# Patient Record
Sex: Male | Born: 1995 | Race: White | Hispanic: No | Marital: Married | Smoking: Never smoker
Health system: Southern US, Community
[De-identification: ages and names within clinical notes are randomized; demographics above are authoritative.]

## PROBLEM LIST (undated history)

## (undated) DIAGNOSIS — G43909 Migraine, unspecified, not intractable, without status migrainosus: Secondary | ICD-10-CM

## (undated) HISTORY — DX: Migraine, unspecified, not intractable, without status migrainosus: G43.909

## (undated) HISTORY — PX: NO PAST SURGERIES: SHX2092

---

## 2013-01-26 ENCOUNTER — Ambulatory Visit: Payer: Self-pay | Admitting: Internal Medicine

## 2016-07-03 ENCOUNTER — Emergency Department: Payer: BLUE CROSS/BLUE SHIELD

## 2016-07-03 ENCOUNTER — Emergency Department
Admission: EM | Admit: 2016-07-03 | Discharge: 2016-07-03 | Disposition: A | Discharge status: Home / Self Care | Payer: BLUE CROSS/BLUE SHIELD | Attending: Emergency Medicine | Admitting: Emergency Medicine

## 2016-07-03 DIAGNOSIS — R0602 Shortness of breath: Secondary | ICD-10-CM | POA: Insufficient documentation

## 2016-07-03 LAB — CBC
HCT: 39.2 % — ABNORMAL LOW (ref 40.0–52.0)
HEMOGLOBIN: 13.5 g/dL (ref 13.0–18.0)
MCH: 29.6 pg (ref 26.0–34.0)
MCHC: 34.4 g/dL (ref 32.0–36.0)
MCV: 86.1 fL (ref 80.0–100.0)
Platelets: 186 10*3/uL (ref 150–440)
RBC: 4.56 MIL/uL (ref 4.40–5.90)
RDW: 13.8 % (ref 11.5–14.5)
WBC: 6.6 10*3/uL (ref 3.8–10.6)

## 2016-07-03 LAB — BASIC METABOLIC PANEL
ANION GAP: 8 (ref 5–15)
BUN: 20 mg/dL (ref 6–20)
CALCIUM: 9.8 mg/dL (ref 8.9–10.3)
CO2: 28 mmol/L (ref 22–32)
Chloride: 104 mmol/L (ref 101–111)
Creatinine, Ser: 0.95 mg/dL (ref 0.61–1.24)
GLUCOSE: 90 mg/dL (ref 65–99)
Potassium: 4.1 mmol/L (ref 3.5–5.1)
SODIUM: 140 mmol/L (ref 135–145)

## 2016-07-03 LAB — TROPONIN I

## 2016-07-03 MED ORDER — FLUTICASONE PROPIONATE 50 MCG/ACT NA SUSP: 1.0000 | Freq: Every day | NASAL | 2 refills | Status: DC

## 2016-07-03 NOTE — ED Notes (Signed)
Pt discharged to home.  Family member driving.  Discharge instructions reviewed.  Verbalized understanding.  No questions or concerns at this time.  Teach back verified.  Pt in NAD.  No items left in ED.   

## 2016-07-03 NOTE — Discharge Instructions (Signed)
Your shortness of breath is likely from seasonal allergies or virus.   Stay out of the heat, drink plenty of water.   Use flonase twice daily. Use over the counter cough and congestion medicine.   See your doctor.   Return to ER if you have worse congestion, shortness of breath, chest pain

## 2016-07-03 NOTE — ED Provider Notes (Signed)
[facility]-EMERGENCY DEPARTMENT Provider Note   CSN: [id number] Arrival date & time: 07/03/16  1458  First Provider Contact:  None       History   Chief Complaint Chief Complaint  Patient presents with  . Shortness of Breath    HPI Paul Holder is a 20 y.o. male otherwise healthy who presented with some shortness breath. Some URI symptoms several weeks ago that resolved. He noticed some occasional shortness of breath specially when he stays still. Denies any shortness of breath with exertion. Denies any cough or fevers. Denies any sinus congestion. Denies any recent travel or history of blood clots. Denies any history or family history of CAD. Sent in by walk in clinic for evaluation.     The history is provided by the patient.    History reviewed. No pertinent past medical history.  There are no active problems to display for this patient.   History reviewed. No pertinent surgical history.     Home Medications    Prior to Admission medications   Not on File    Family History No family history on file.  Social History Social History  Substance Use Topics  . Smoking status: Never Smoker  . Smokeless tobacco: Never Used  . Alcohol use No     Allergies   Review of patient's allergies indicates no known allergies.   Review of Systems Review of Systems  Respiratory: Positive for shortness of breath.   All other systems reviewed and are negative.    Physical Exam Updated Vital Signs BP (!) 106/53 (BP Location: Left Arm)   Temp 97.6 F (36.4 C) (Oral)   Resp 16   Ht 5\' 11"  (1.803 m)   Wt 160 lb (72.6 kg)   SpO2 100%   BMI 22.32 kg/m   Physical Exam  Constitutional: He appears well-developed and well-nourished.  HENT:  Head: Normocephalic and atraumatic.  Mouth/Throat: Oropharynx is clear and moist.  Eyes: EOM are normal. Pupils are equal, round, and reactive to light.  Neck: Normal range of motion. Neck supple.  Cardiovascular: Normal rate and  regular rhythm.   Pulmonary/Chest: Effort normal and breath sounds normal.  Abdominal: Soft. Bowel sounds are normal.  Musculoskeletal: Normal range of motion. He exhibits no edema or tenderness.  Neurological: He is alert. He has normal reflexes.  Skin: Skin is warm.  Psychiatric: He has a normal mood and affect.  Nursing note and vitals reviewed.    ED Treatments / Results  Labs (all labs ordered are listed, but only abnormal results are displayed) Labs Reviewed  CBC - Abnormal; Notable for the following:       Result Value   HCT 39.2 (*)    All other components within normal limits  BASIC METABOLIC PANEL  TROPONIN I    EKG  EKG Interpretation None       ED ECG REPORT I, Junaid Wurzer, the attending physician, personally viewed and interpreted this ECG.   Date: 07/03/2016  EKG Time: 15:16  Rate: 55  Rhythm: normal EKG, normal sinus rhythm, sinus bradycardia  Axis: normal  Intervals:none  ST&T Change: none  Radiology Dg Chest 2 View  Result Date: 07/03/2016 CLINICAL DATA:  PT claims he has shortness of breath. Symptom begin earlier this week. EXAM: CHEST  2 VIEW COMPARISON:  None. FINDINGS: The heart size and mediastinal contours are within normal limits. Both lungs are clear. No pleural effusion or pneumothorax. The visualized skeletal structures are unremarkable. IMPRESSION: Normal chest radiographs. Electronically Signed  By: Amie Portland M.D.   On: 07/03/2016 15:45   Procedures Procedures (including critical care time)  Medications Ordered in ED Medications - No data to display   Initial Impression / Assessment and Plan / ED Course  I have reviewed the triage vital signs and the nursing notes.  Pertinent labs & imaging results that were available during my care of the patient were reviewed by me and considered in my medical decision making (see chart for details).  Clinical Course     Paul Holder is a 20 y.o. male here with subjective shortness of  breath. Not hypoxic or tachy or hypotensive in the ED. PERC neg so will not need D-dimer. Labs and CXR unremarkable. Likely viral vs seasonal allergies. Recommend flonase, over the counter cough meds.    Final Clinical Impressions(s) / ED Diagnoses   Final diagnoses:  None    New Prescriptions New Prescriptions   No medications on file     Charlynne Pander, MD 07/03/16 [phone]

## 2016-07-03 NOTE — ED Triage Notes (Signed)
Pt sent from [facility] with c/O SOB with rest.. States his systolic was 99 sitting and 107 standing.. Pt states he used homeopathic treatment 2 weeks ago for URI sx.. Denies any chest pain.Marland Kitchen

## 2016-07-25 ENCOUNTER — Encounter: Payer: Self-pay | Admitting: Pulmonary Disease

## 2016-07-25 ENCOUNTER — Ambulatory Visit (INDEPENDENT_AMBULATORY_CARE_PROVIDER_SITE_OTHER): Payer: BLUE CROSS/BLUE SHIELD | Admitting: Pulmonary Disease

## 2016-07-25 VITALS — BP 122/70 | HR 71 | Ht 71.0 in | Wt 168.0 lb

## 2016-07-25 DIAGNOSIS — J45998 Other asthma: Secondary | ICD-10-CM

## 2016-07-25 DIAGNOSIS — R0600 Dyspnea, unspecified: Secondary | ICD-10-CM | POA: Diagnosis not present

## 2016-07-25 MED ORDER — FLUTICASONE FUROATE-VILANTEROL 100-25 MCG/INH IN AEPB: 1.0000 | INHALATION_SPRAY | Freq: Every day | RESPIRATORY_TRACT | Status: DC

## 2016-07-25 MED ORDER — FLUTICASONE FUROATE-VILANTEROL 100-25 MCG/INH IN AEPB: 1.0000 | INHALATION_SPRAY | Freq: Every day | RESPIRATORY_TRACT | 0 refills | Status: AC

## 2016-07-25 MED ORDER — ALBUTEROL SULFATE HFA 108 (90 BASE) MCG/ACT IN AERS: 2.0000 | INHALATION_SPRAY | Freq: Four times a day (QID) | RESPIRATORY_TRACT | 6 refills | Status: DC | PRN

## 2016-07-25 NOTE — Progress Notes (Signed)
Patient ID: Paul SmothersMatthew Holder, male   DOB: 06/18/1996, 20 y.o.   MRN: [id number] Patient seen in the office today and instructed on use of Breo Ellipta.  Patient expressed understanding and demonstrated technique.

## 2016-07-25 NOTE — Progress Notes (Signed)
[email or web address]

## 2016-07-25 NOTE — Progress Notes (Signed)
PULMONARY CONSULT NOTE  Requesting MD/Service: self Date of initial consultation: 07/25/16 Reason for consultation: exertional dyspnea  PT PROFILE: 20 athletic M (martial arts) with 1 month of dyspnea after "chest cold"  HPI:  [age] M with no significant PMH, no prior hx of pulmonary or respiratory problems. He is athletic and trains vigorously in martial arts. In mid-July he developed a "chest cold" with cough, mucopurulent sputum, chest tightness and dyspnea. The chest tightness and dyspnea have persisted with exertion and it is interfering with his training. Currently denies CP, fever, purulent sputum, hemoptysis, LE edema and calf tenderness   History reviewed. No pertinent past medical history.  History reviewed. No pertinent surgical history.  MEDICATIONS: I have reviewed all medications and confirmed regimen as documented  Social History   Social History  . Marital status: Single    Spouse name: N/A  . Number of children: N/A  . Years of education: N/A   Occupational History  . Not on file.   Social History Main Topics  . Smoking status: Never Smoker  . Smokeless tobacco: Never Used  . Alcohol use No  . Drug use: No  . Sexual activity: Yes   Other Topics Concern  . Not on file   Social History Narrative  . No narrative on file  [employer] @ [employer] [address]. 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 Never smoked. No illicit drugs or significant alcohol abuse  FH: noncontributory  ROS: No fever, myalgias/arthralgias, unexplained weight loss or weight gain No new focal weakness or sensory deficits No otalgia, hearing loss, visual changes, nasal and sinus symptoms, mouth and throat problems No neck pain or adenopathy No abdominal pain, N/V/D, diarrhea, change in bowel pattern No dysuria, change in urinary pattern   Vitals:   07/25/16 0859  BP: 122/70  Pulse: 71  SpO2: 98%  Weight: 168 lb (76.2 kg)  Height: 5\' 11"  (1.803 m)     EXAM:  Gen: WDWN, No overt respiratory distress HEENT: NCAT,  sclera white, oropharynx normal Neck: Supple without LAN, thyromegaly, JVD Lungs: breath sounds: full, percussion: normal, No adventitious sounds Cardiovascular: RRR, no murmurs noted Abdomen: Soft, nontender, normal BS Ext: without clubbing, cyanosis, edema Neuro: CNs grossly intact, motor and sensory intact Skin: Limited exam, no lesions noted  DATA:   BMP Latest Ref Rng & Units 07/03/2016  Glucose 65 - 99 mg/dL 90  BUN 6 - 20 mg/dL 20  Creatinine 6.210.61 - 3.081.24 mg/dL 6.570.95  Sodium 846135 - 962145 mmol/L 140  Potassium 3.5 - 5.1 mmol/L 4.1  Chloride 101 - 111 mmol/L 104  CO2 22 - 32 mmol/L 28  Calcium 8.9 - 10.3 mg/dL 9.8    CBC Latest Ref Rng & Units 07/03/2016  WBC 3.8 - 10.6 K/uL 6.6  Hemoglobin 13.0 - 18.0 g/dL 95.213.5  Hematocrit 84.140.0 - 52.0 % 39.2(L)  Platelets 150 - 440 K/uL 186    CXR (07/03/16):  NACPD Spirometry (07/25/16): entirely normal  IMPRESSION:     ICD-9-CM ICD-10-CM   1. Dyspnea 786.09 R06.00 Spirometry with Graph  2. 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 Also instructed to take prior to training ROV 08/14/16   Paul Fischeravid Santino Kinsella, MD PCCM service Mobile [phone] Pager [phone] 07/25/2016

## 2016-07-31 ENCOUNTER — Ambulatory Visit (INDEPENDENT_AMBULATORY_CARE_PROVIDER_SITE_OTHER): Payer: BLUE CROSS/BLUE SHIELD | Admitting: Family Medicine

## 2016-07-31 ENCOUNTER — Encounter: Payer: Self-pay | Admitting: Family Medicine

## 2016-07-31 DIAGNOSIS — R0602 Shortness of breath: Secondary | ICD-10-CM | POA: Diagnosis not present

## 2016-07-31 NOTE — Patient Instructions (Signed)
Continue the inhalers.  I anticipate you're going to improve over the next few weeks.  Consider a course of steroids if you do not improve.  Take care  Dr. Adriana Simasook

## 2016-07-31 NOTE — Progress Notes (Signed)
Subjective:  Patient ID: Paul Holder, male    DOB: 19-Jun-1996  Age: 20 y.o. MRN: [id number]  CC: SOB  HPI Paul Holder is a 20 y.o. male presents to the clinic today with the above complaint.  SOB  Patient states that he has had shortness of breath and chest tightness for the past month.  He states that it started after a bout of acute bronchitis.  He denies cough.  No reports of chest pain or palpitations.  He states it is difficult for him to get a good deep breath.  No associated fever or chills.  No weight loss.  Patient states that he does clear his throat often and occasionally gets up sputum.  He has seen pulmonology recently regarding this. He was also seen in the ED at the end of July. Chest x-ray normal. Spirometry normal. Patient is currently on Breo and PRN albuterol per pulm.  Patient states that he's had no improvement with the inhalers.  No known exacerbating or relieving factors. No other complaints at this time.  PMH, Surgical Hx, Family Hx, Social History reviewed and updated as below.  Past Medical History:  Diagnosis Date  . Migraines    Past Surgical History:  Procedure Laterality Date  . NO PAST SURGERIES     History reviewed. No pertinent family history. Patient denies family history.  Social History  Substance Use Topics  . Smoking status: Never Smoker  . Smokeless tobacco: Never Used  . Alcohol use Yes   Review of Systems  Respiratory: Positive for shortness of breath.   All other systems reviewed and are negative.  Objective:   Today's Vitals: BP 110/62 (BP Location: Right Arm, Patient Position: Sitting, Cuff Size: Normal)   Pulse (!) 59   Temp 98.2 F (36.8 C) (Oral)   Ht 5\' 11"  (1.803 m)   Wt 167 lb 4 oz (75.9 kg)   SpO2 98%   BMI 23.33 kg/m   Physical Exam  Constitutional: He is oriented to person, place, and time. He appears well-developed. No distress.  HENT:  Head: Normocephalic and atraumatic.  Mouth/Throat:  No oropharyngeal exudate.  Eyes: Conjunctivae are normal. No scleral icterus.  Neck: Neck supple.  Cardiovascular: Normal rate and regular rhythm.   Pulmonary/Chest: Effort normal. He has no wheezes. He has no rales.  Abdominal: Soft. He exhibits no distension. There is no tenderness. There is no rebound and no guarding.  Musculoskeletal: Normal range of motion. He exhibits no edema.  Lymphadenopathy:    He has no cervical adenopathy.  Neurological: He is alert and oriented to person, place, and time.  Skin: Skin is warm. No rash noted.  Psychiatric: He has a normal mood and affect.  Vitals reviewed.  Assessment & Plan:   Problem List Items Addressed This Visit    SOB (shortness of breath)    New problem.  Unclear etiology/prognosis. Favor post infectious asthma. Patient has had workup for this. Advise continue use of his Breo and Albuterol. Offered course of systemic steroids and he declined. I anticipate he will recover over the new few weeks. If not will need follow up.       Other Visit Diagnoses   None.    Outpatient Encounter Prescriptions as of 07/31/2016  Medication Sig  . albuterol (PROVENTIL HFA;VENTOLIN HFA) 108 (90 Base) MCG/ACT inhaler Inhale 2 puffs into the lungs every 6 (six) hours as needed for wheezing or shortness of breath.  Marland Kitchen acetaminophen (TYLENOL) 500 MG tablet Take 500 mg  by mouth as needed.   Facility-Administered Encounter Medications as of 07/31/2016  Medication  . fluticasone furoate-vilanterol (BREO ELLIPTA) 100-25 MCG/INH 1 puff   Follow-up: PRN  Everlene OtherJayce Andersson L[place] DO [facility] [place]

## 2016-07-31 NOTE — Assessment & Plan Note (Signed)
New problem.  Unclear etiology/prognosis. Favor post infectious asthma. Patient has had workup for this. Advise continue use of his Breo and Albuterol. Offered course of systemic steroids and he declined. I anticipate he will recover over the new few weeks. If not will need follow up.

## 2016-08-15 ENCOUNTER — Ambulatory Visit: Payer: BLUE CROSS/BLUE SHIELD | Admitting: Pulmonary Disease

## 2017-04-16 IMAGING — CR DG CHEST 2V
1 series · 2 of 2 positions shown · non-contrast
Comparison: None.

CLINICAL DATA: PT claims he has shortness of breath. Symptom begin
earlier this week.

EXAM:
CHEST  2 VIEW

[Series 1: dg chest 2 view · 0.14mm/px · 2 of 2 slices shown]
[im 1/2]
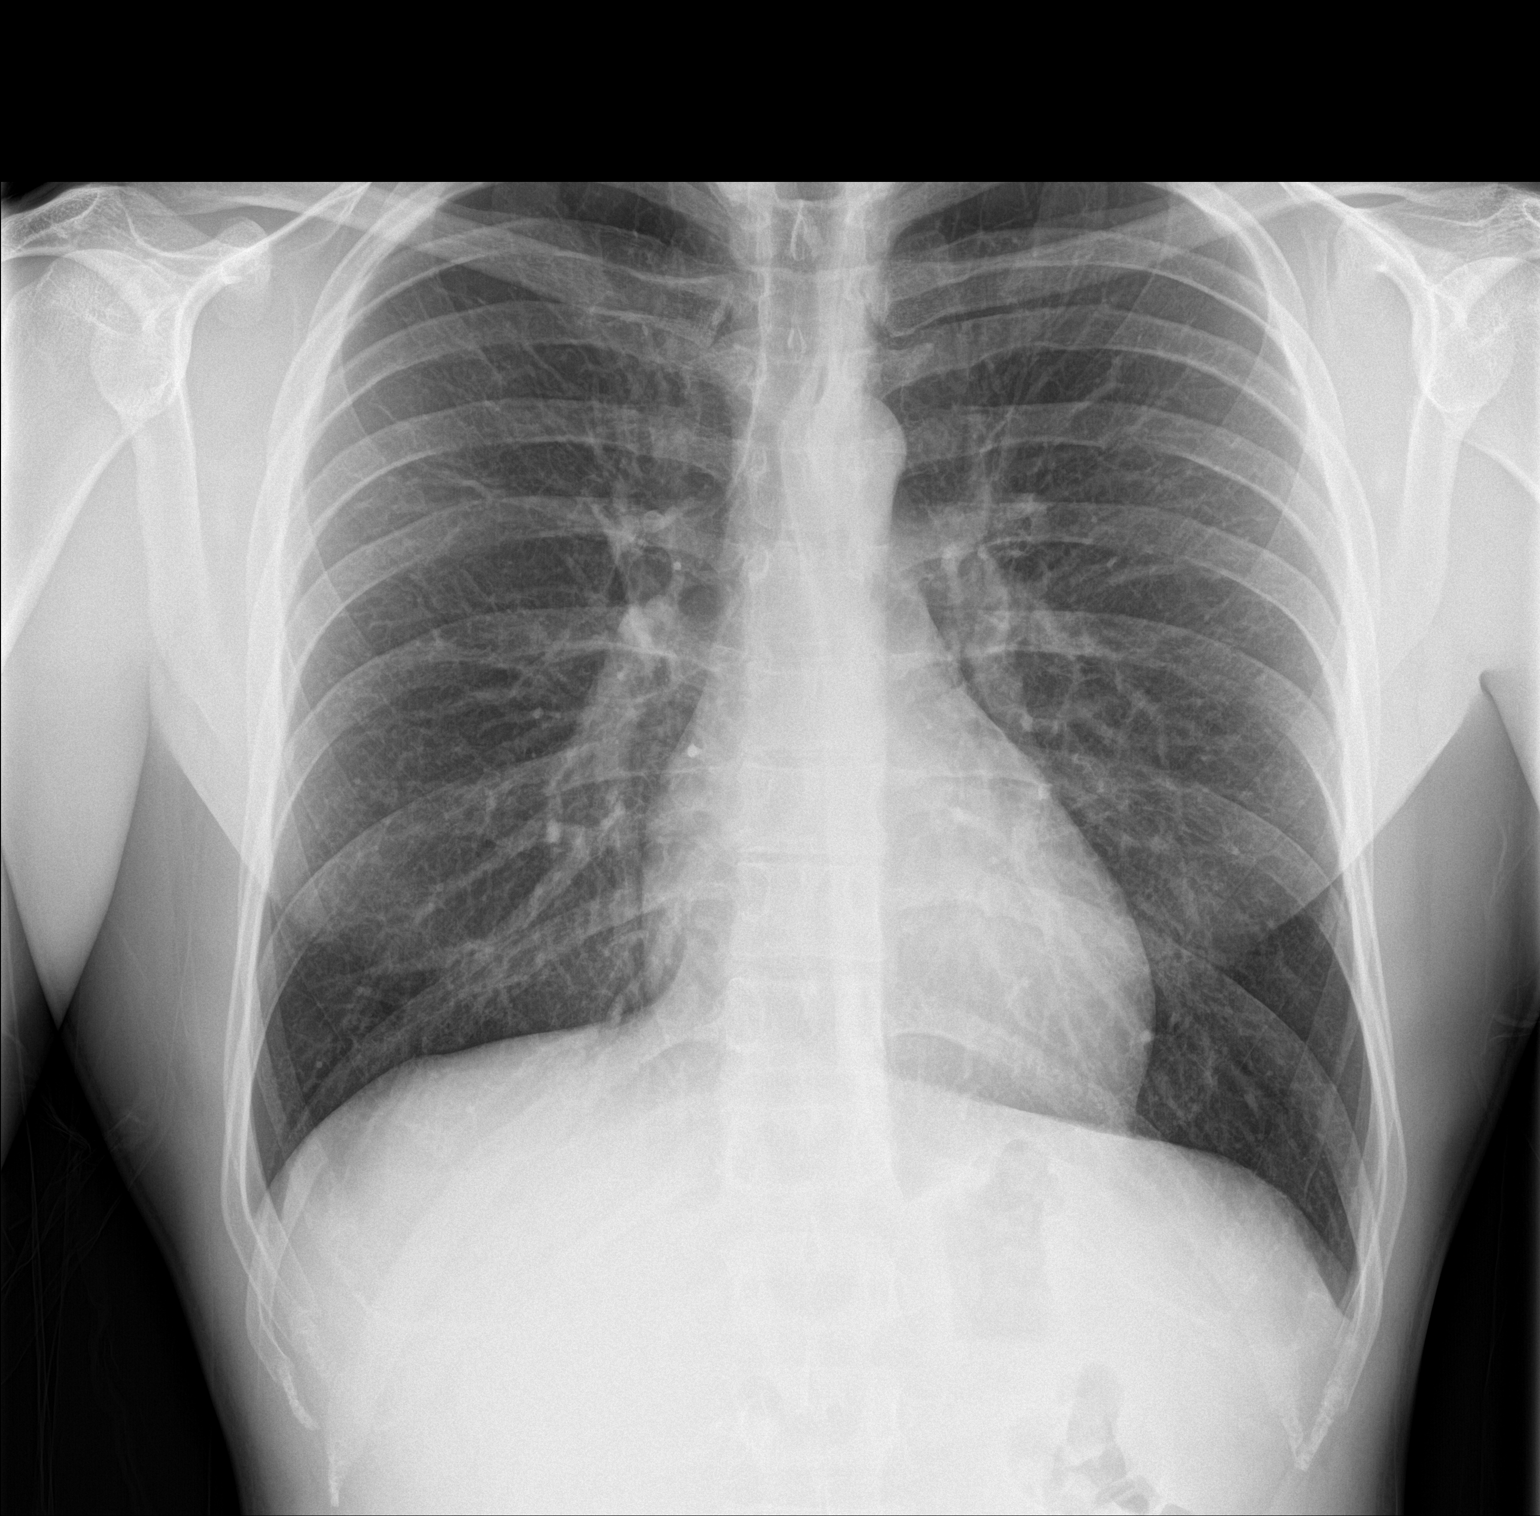
[im 2/2]
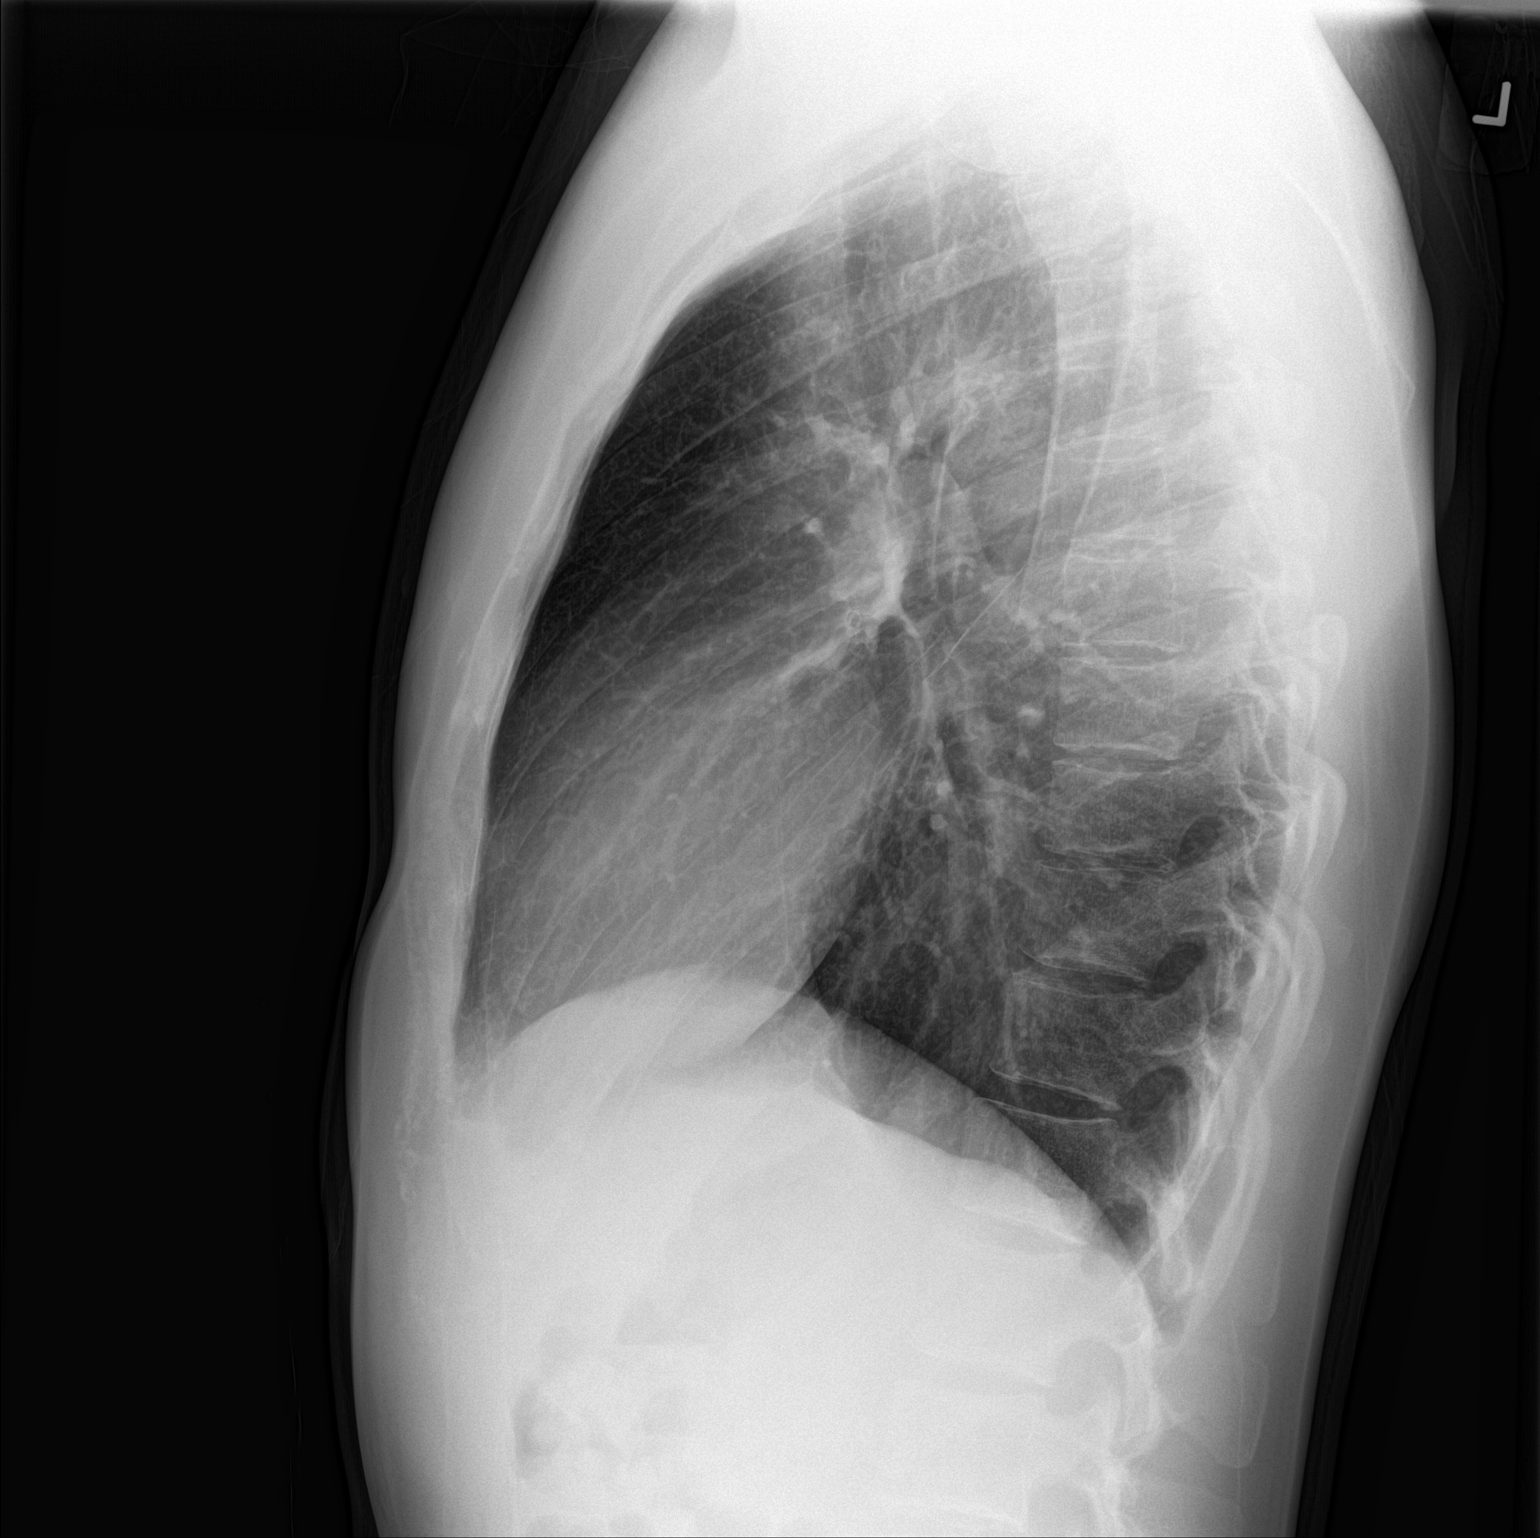

[2 of 2 positions shown; findings below may reference images not displayed]

FINDINGS: The heart size and mediastinal contours are within normal limits.
Both lungs are clear. No pleural effusion or pneumothorax. The
visualized skeletal structures are unremarkable.
IMPRESSION: Normal chest radiographs.

## 2023-03-06 ENCOUNTER — Ambulatory Visit
Admission: EM | Admit: 2023-03-06 | Discharge: 2023-03-06 | Disposition: A | Discharge status: Home / Self Care | Payer: PRIVATE HEALTH INSURANCE | Attending: Urgent Care | Admitting: Urgent Care

## 2023-03-06 DIAGNOSIS — J029 Acute pharyngitis, unspecified: Secondary | ICD-10-CM

## 2023-03-06 LAB — POCT RAPID STREP A (OFFICE): Rapid Strep A Screen: NEGATIVE

## 2023-03-06 MED ORDER — AMOXICILLIN 500 MG PO CAPS: 500.0000 mg | ORAL_CAPSULE | Freq: Two times a day (BID) | ORAL | 0 refills | Status: AC

## 2023-03-06 NOTE — ED Provider Notes (Signed)
Roderic Palau    CSN: [id number] Arrival date & time: 03/06/23  [id number]      History   Chief Complaint Chief Complaint  Patient presents with   Sore Throat    HPI Paul Holder is a 27 y.o. male.    Sore Throat    Presents to urgent care with complaint of sore throat starting 10 days ago and not improving.  He reports bilateral otalgia.  Using OTC medication for symptom control.  Endorses some cough. No fever.  Past Medical History:  Diagnosis Date   Migraines     Patient Active Problem List   Diagnosis Date Noted   SOB (shortness of breath) 07/31/2016    Past Surgical History:  Procedure Laterality Date   NO PAST SURGERIES         Home Medications    Prior to Admission medications   Medication Sig Start Date End Date Taking? Authorizing Provider  acetaminophen (TYLENOL) 500 MG tablet Take 500 mg by mouth as needed.   Yes [provider]  albuterol (PROVENTIL HFA;VENTOLIN HFA) 108 (90 Base) MCG/ACT inhaler Inhale 2 puffs into the lungs every 6 (six) hours as needed for wheezing or shortness of breath. 07/25/16   Wilhelmina Mcardle, MD    Family History History reviewed. No pertinent family history.  Social History Social History   Tobacco Use   Smoking status: Never   Smokeless tobacco: Never  Substance Use Topics   Alcohol use: Yes   Drug use: No     Allergies   Patient has no known allergies.   Review of Systems Review of Systems   Physical Exam Triage Vital Signs ED Triage Vitals [03/06/23 0909]  Enc Vitals Group     BP 136/83     Pulse Rate 69     Resp 16     Temp 98.7 F (37.1 C)     Temp Source Oral     SpO2 97 %     Weight      Height      Head Circumference      Peak Flow      Pain Score 5     Pain Loc      Pain Edu?      Excl. in [place]?    No data found.  Updated Vital Signs BP 136/83 (BP Location: Right Arm)   Pulse 69   Temp 98.7 F (37.1 C) (Oral)   Resp 16   SpO2 97%   Visual Acuity Right  Eye Distance:   Left Eye Distance:   Bilateral Distance:    Right Eye Near:   Left Eye Near:    Bilateral Near:     Physical Exam Vitals reviewed.  Constitutional:      Appearance: He is well-developed.  HENT:     Right Ear: Tympanic membrane normal.     Left Ear: Tympanic membrane normal.     Mouth/Throat:     Mouth: Mucous membranes are moist.     Pharynx: Posterior oropharyngeal erythema present. No oropharyngeal exudate.     Tonsils: No tonsillar exudate.     Comments: Possible tonsillar stone is present. Cardiovascular:     Rate and Rhythm: Normal rate and regular rhythm.     Heart sounds: Normal heart sounds.  Pulmonary:     Effort: Pulmonary effort is normal.     Breath sounds: Normal breath sounds.  Skin:    General: Skin is warm and dry.  Neurological:  General: No focal deficit present.     Mental Status: He is alert and oriented to person, place, and time.  Psychiatric:        Mood and Affect: Mood normal.        Behavior: Behavior normal.      UC Treatments / Results  Labs (all labs ordered are listed, but only abnormal results are displayed) Labs Reviewed  POCT RAPID STREP A (OFFICE)    EKG   Radiology No results found.  Procedures Procedures (including critical care time)  Medications Ordered in UC Medications - No data to display  Initial Impression / Assessment and Plan / UC Course  I have reviewed the triage vital signs and the nursing notes.  Pertinent labs & imaging results that were available during my care of the patient were reviewed by me and considered in my medical decision making (see chart for details).   Patient is afebrile here without recent antipyretics. Satting well on room air. Overall is well appearing, well hydrated, without respiratory distress. Pulmonary exam is unremarkable.  Lungs CTAB without wheezing, rhonchi, rales.  Very erythematous pharynx with right-sided tonsillar stone removed via curette.  Rapid strep  is negative however patient will be treated for strep based on a clinical diagnosis.  Sending confirmatory culture.  Will continue to use OTC medication for symptom control.  Patient acknowledges understanding and agreement with this treatment plan.    Final Clinical Impressions(s) / UC Diagnoses   Final diagnoses:  None   Discharge Instructions   None    ED Prescriptions   None    PDMP not reviewed this encounter.   Rose Phi, [place] 03/06/23 [phone]

## 2023-03-06 NOTE — ED Triage Notes (Signed)
Sore throat that started 10 days ago that has not improved. Now having some pain in ears. Taking tylenol and mucinex but no relief of symptoms.

## 2023-03-06 NOTE — Discharge Instructions (Signed)
Follow up here or with your primary care provider if your symptoms are worsening or not improving with treatment.     

## 2023-03-09 ENCOUNTER — Telehealth: Payer: Self-pay

## 2023-03-09 NOTE — Telephone Encounter (Signed)
Unable to reach patient regarding strep culture lab error. Voicemail left for patient to return for recollection.

## 2024-04-14 ENCOUNTER — Emergency Department
Admission: EM | Admit: 2024-04-14 | Discharge: 2024-04-15 | Disposition: A | Discharge status: Home / Self Care | Attending: Emergency Medicine | Admitting: Emergency Medicine

## 2024-04-14 ENCOUNTER — Emergency Department

## 2024-04-14 ENCOUNTER — Other Ambulatory Visit: Payer: Self-pay

## 2024-04-14 DIAGNOSIS — R0789 Other chest pain: Secondary | ICD-10-CM | POA: Diagnosis present

## 2024-04-14 DIAGNOSIS — R079 Chest pain, unspecified: Secondary | ICD-10-CM

## 2024-04-14 LAB — BASIC METABOLIC PANEL WITH GFR
Anion gap: 9 (ref 5–15)
BUN: 21 mg/dL — ABNORMAL HIGH (ref 6–20)
CO2: 27 mmol/L (ref 22–32)
Calcium: 9.7 mg/dL (ref 8.9–10.3)
Chloride: 102 mmol/L (ref 98–111)
Creatinine, Ser: 1.09 mg/dL (ref 0.61–1.24)
GFR, Estimated: >60 mL/min (ref >60)
Glucose, Bld: 102 mg/dL — ABNORMAL HIGH (ref 70–99)
Potassium: 3.9 mmol/L (ref 3.5–5.1)
Sodium: 138 mmol/L (ref 135–145)

## 2024-04-14 LAB — CBC
HCT: 39.4 % (ref 39.0–52.0)
Hemoglobin: 13.7 g/dL (ref 13.0–17.0)
MCH: 29.1 pg (ref 26.0–34.0)
MCHC: 34.8 g/dL (ref 30.0–36.0)
MCV: 83.7 fL (ref 80.0–100.0)
Platelets: 248 10*3/uL (ref 150–400)
RBC: 4.71 MIL/uL (ref 4.22–5.81)
RDW: 12 % (ref 11.5–15.5)
WBC: 7.1 10*3/uL (ref 4.0–10.5)
nRBC: 0 % (ref 0.0–0.2)

## 2024-04-14 LAB — TROPONIN I (HIGH SENSITIVITY): Troponin I (High Sensitivity): <2 ng/L (ref <18)

## 2024-04-14 MED ORDER — LIDOCAINE VISCOUS HCL 2 % MT SOLN
15.0000 mL | Freq: Once | OROMUCOSAL | Status: AC
  Administered 2024-04-14: 15 mL via ORAL
  Filled 2024-04-14: qty 15

## 2024-04-14 MED ORDER — ALUM & MAG HYDROXIDE-SIMETH 200-200-20 MG/5ML PO SUSP
30.0000 mL | Freq: Once | ORAL | Status: AC
  Administered 2024-04-14: 30 mL via ORAL
  Filled 2024-04-14: qty 30

## 2024-04-14 MED ORDER — PANTOPRAZOLE SODIUM 40 MG PO TBEC: 40.0000 mg | DELAYED_RELEASE_TABLET | Freq: Every day | ORAL | 1 refills | Status: DC

## 2024-04-14 NOTE — ED Provider Notes (Signed)
 [facility] Provider Note    Event Date/Time   First MD Initiated Contact with Patient 04/14/24 2137     (approximate)   History   Chest Pain   HPI  Paul Holder is a 28 y.o. male who presents to the emergency department today because of concerns for chest pain.  Located in the left part of his chest.  He has been having discomfort there on and off for over a month.  Today it started around 10:00 this morning.  He was at work when it started.  At the time my exam he still having some discomfort.  Denies any associated shortness of breath.  No diaphoresis.  Patient denies any past medical history.  He does not know his family history.     Physical Exam   Triage Vital Signs: ED Triage Vitals  Encounter Vitals Group     BP 04/14/24 2058 108/76     Systolic BP Percentile --      Diastolic BP Percentile --      Pulse Rate 04/14/24 2058 66     Resp 04/14/24 2058 18     Temp 04/14/24 2058 98 F (36.7 C)     Temp Source 04/14/24 2058 Oral     SpO2 04/14/24 2058 100 %     Weight 04/14/24 2058 180 lb (81.6 kg)     Height 04/14/24 2058 5\' 11"  (1.803 m)     Head Circumference --      Peak Flow --      Pain Score 04/14/24 2059 3     Pain Loc --      Pain Education --      Exclude from Growth Chart --     Most recent vital signs: Vitals:   04/14/24 2058  BP: 108/76  Pulse: 66  Resp: 18  Temp: 98 F (36.7 C)  SpO2: 100%   General: Awake, alert, oriented. CV:  Good peripheral perfusion. Regular rate and rhythm. Resp:  Normal effort. Lungs clear. Abd:  No distention.    ED Results / Procedures / Treatments   Labs (all labs ordered are listed, but only abnormal results are displayed) Labs Reviewed  BASIC METABOLIC PANEL WITH GFR - Abnormal; Notable for the following components:      Result Value   Glucose, Bld 102 (*)    BUN 21 (*)    All other components within normal limits  CBC  TROPONIN I (HIGH SENSITIVITY)     EKG  I, Marylynn Soho, attending physician, personally viewed and interpreted this EKG  EKG Time: 2052 Rate: 63 Rhythm: normal sinus rhythm Axis: normal Intervals: qtc 388 QRS: narrow, q waves II, III, aVF ST changes: no st elevation Impression: abnormal ekg    RADIOLOGY I independently interpreted and visualized the CXR. My interpretation: No pneumonia Radiology interpretation:  IMPRESSION:  No active cardiopulmonary disease.     PROCEDURES:  Critical Care performed: No  MEDICATIONS ORDERED IN ED: Medications - No data to display   IMPRESSION / MDM / ASSESSMENT AND PLAN / ED COURSE  I reviewed the triage vital signs and the nursing notes.                              Differential diagnosis includes, but is not limited to, ACS, pneumonia, pneumothorax, esophagitis  Patient's presentation is most consistent with acute presentation with potential threat to life or bodily function.  Patient  presented to the emergency department today because of concerns for chest pain that has been intermittent for a little over a month.  On exam patient with clear lungs, no gallops.  EKG without any concerning arrhythmia although does show some Q waves.  In comparison to EKG performed in 2017 the Q waves were also present at that time although not as pronounced.  Troponin was negative.  I would expect some elevation given the pain been going on for weeks and that today it started at 10 if the pain signified ACS.  Did try GI cocktail patient got perhaps some relief.  This time I think it is reasonable for patient be discharged.  Will have patient follow-up with cardiology given abnormal EKG.      FINAL CLINICAL IMPRESSION(S) / ED DIAGNOSES   Final diagnoses:  Nonspecific chest pain        Rx / DC Orders    Note:  This document was prepared using Dragon voice recognition software and may include unintentional dictation errors.    Marylynn Soho, MD 04/14/24 [phone]

## 2024-04-14 NOTE — ED Triage Notes (Signed)
 Patient C/O intermittent mid-sternal chest pain and tightness. Today's episode was worse, and occurred around 1000 this morning. Patient denies any prior cardiac history besides these chest pain episodes.

## 2024-04-18 DIAGNOSIS — R079 Chest pain, unspecified: Secondary | ICD-10-CM | POA: Insufficient documentation

## 2024-04-18 NOTE — Progress Notes (Unsigned)
 Cardiology Office Note  Date:  04/19/2024   ID:  Paul Holder, DOB 05-May-1996, MRN [id number]  PCP:  Patient, No Pcp Per   Chief Complaint  Patient presents with   New Patient (Initial Visit)    Ref by Dr. Hendrick Locke for chest pain and shortness of breath. Patient c/o shortness of breath with a dull ache off & on since Feb. 2025; symptoms are mostly with activity.     HPI:  [name] is a 28 year old gentleman with past medical history of Chest pain feb 2025, march 25, 3-4 episodes, last last week Who presents by referral from Dr. Hendrick Locke for consultation of his shortness of breath, chest pain  Reports having 3-4 episodes of chest pain dating back to February, episode in March, episode developing last week Feels most recent episode starting last Thursday has been the worst discomfort of the episodes  Seen in the emergency room Apr 14, 2024 for his chest pain located left part of his chest.  Per the ER notes, started around 10:00 that morning, he was at work when it started.  Works as a Optometrist  Worse with exertion, some positional component Has not tried any medications to see if that will resolve Reports it does not feel like ribs or muscle or chest wall pain, feels deeper  Family hx none  Denies any smoking history, no diabetes  EKG personally reviewed by myself on todays visit EKG Interpretation Date/Time:  Tuesday Apr 19 2024 16:01:34 EDT Ventricular Rate:  62 PR Interval:  140 QRS Duration:  94 QT Interval:  394 QTC Calculation: 399 R Axis:   59  Text Interpretation: Normal sinus rhythm Normal ECG When compared with ECG of 14-Apr-2024 20:52, No significant change was found Confirmed by Belva Boyden [phone]) on 04/19/2024 4:16:21 PM  No change on EKG compared to 2017  PMH:   has a past medical history of Migraines.  PSH:    Past Surgical History:  Procedure Laterality Date   NO PAST SURGERIES      Current Outpatient Medications  Medication Sig Dispense  Refill   acetaminophen (TYLENOL) 500 MG tablet Take 500 mg by mouth as needed.     albuterol  (PROVENTIL  HFA;VENTOLIN  HFA) 108 (90 Base) MCG/ACT inhaler Inhale 2 puffs into the lungs every 6 (six) hours as needed for wheezing or shortness of breath. 1 Inhaler 6   pantoprazole  (PROTONIX ) 40 MG tablet Take 1 tablet (40 mg total) by mouth daily. 30 tablet 1   Current Facility-Administered Medications  Medication Dose Route Frequency Provider Last Rate Last Admin   fluticasone  furoate-vilanterol (BREO ELLIPTA ) 100-25 MCG/INH 1 puff  1 puff Inhalation Daily Simonds, David B, MD         Allergies:   Patient has no known allergies.   Social History:  The patient  reports that he has never smoked. He has never used smokeless tobacco. He reports current alcohol use. He reports that he does not use drugs.   Family History:   family history includes Heart attack in his paternal grandfather; Heart murmur in his paternal grandfather.    Review of Systems: Review of Systems  Constitutional: Negative.   HENT: Negative.    Respiratory: Negative.    Cardiovascular:  Positive for chest pain.  Gastrointestinal: Negative.   Musculoskeletal: Negative.   Neurological: Negative.   Psychiatric/Behavioral: Negative.    All other systems reviewed and are negative.   PHYSICAL EXAM: VS:  BP 128/70 (BP Location: Right Arm, Patient Position: Sitting, Cuff  Size: Normal)   Pulse 62   Ht 5\' 11"  (1.803 m)   Wt 182 lb 8 oz (82.8 kg)   SpO2 98%   BMI 25.45 kg/m  , BMI Body mass index is 25.45 kg/m. GEN: Well nourished, well developed, in no acute distress HEENT: normal Neck: no JVD, carotid bruits, or masses Cardiac: RRR; no murmurs, rubs, or gallops,no edema  Respiratory:  clear to auscultation bilaterally, normal work of breathing GI: soft, nontender, nondistended, + BS MS: no deformity or atrophy Skin: warm and dry, no rash Neuro:  Strength and sensation are intact Psych: euthymic mood, full  affect    Recent Labs: 04/14/2024: BUN 21; Creatinine, Ser 1.09; Hemoglobin 13.7; Platelets 248; Potassium 3.9; Sodium 138    Lipid Panel No results found for: "CHOL", "HDL", "LDLCALC", "TRIG"    Wt Readings from Last 3 Encounters:  04/19/24 182 lb 8 oz (82.8 kg)  04/14/24 180 lb (81.6 kg)  07/31/16 167 lb 4 oz (75.9 kg)     ASSESSMENT AND PLAN:  Problem List Items Addressed This Visit     Chest pain of uncertain etiology - Primary   Relevant Orders   EKG 12-Lead (Completed)   Atypical chest pain Etiology unclear, unable to definitively exclude chest wall pain, musculoskeletal etiology Other differential includes pericarditis, pleurisy Less likely underlying cardiac pathology given no significant risk factors, rare episodes starting this past February -Echocardiogram ordered, Lab work ordered for pericarditis including CRP, sed rate -Recommended he try ibuprofen taper with colchicine Ibuprofen 600 3 times daily for 2 weeks then down to 400 3 times daily 2 weeks then down to 200 mg 3 times daily -Colchicine 0.6 twice daily - If echo normal, symptoms do not improve, could consider cardiac CTA  Shortness of breath In the setting of chest pain above, echocardiogram ordered, management as above  Signed, Juanda Noon, M.D., Ph.D. [facility] [place], [place] [phone]

## 2024-04-19 ENCOUNTER — Encounter: Payer: Self-pay | Admitting: Cardiovascular Disease

## 2024-04-19 ENCOUNTER — Ambulatory Visit: Attending: Cardiovascular Disease | Admitting: Cardiovascular Disease

## 2024-04-19 ENCOUNTER — Other Ambulatory Visit
Admission: RE | Admit: 2024-04-19 | Discharge: 2024-04-19 | Disposition: A | Discharge status: Home / Self Care | Source: Ambulatory Visit | Attending: Cardiovascular Disease | Admitting: Cardiovascular Disease

## 2024-04-19 VITALS — BP 128/70 | HR 62 | Ht 71.0 in | Wt 182.5 lb

## 2024-04-19 DIAGNOSIS — Z79899 Other long term (current) drug therapy: Secondary | ICD-10-CM | POA: Diagnosis present

## 2024-04-19 DIAGNOSIS — R079 Chest pain, unspecified: Secondary | ICD-10-CM

## 2024-04-19 LAB — SEDIMENTATION RATE: Sed Rate: 2 mm/h (ref 0–15)

## 2024-04-19 LAB — C-REACTIVE PROTEIN: CRP: 1.3 mg/dL — ABNORMAL HIGH (ref <1.0)

## 2024-04-19 MED ORDER — COLCHICINE 0.6 MG PO TABS: 0.6000 mg | ORAL_TABLET | Freq: Every day | ORAL | 1 refills | Status: DC

## 2024-04-19 NOTE — Patient Instructions (Addendum)
 Medication Instructions:  Ibuprofen 600 mg three time a day for 2 weeks Then down to 400 mg three time a day, two weeks Then 200 mg three times a day for 2 weeks Colchine 0.6 mg twice a day (last one to get stopped)  If you need a refill on your cardiac medications before your next appointment, please call your pharmacy.   Lab work: Please go to the [facility] for the following labs: CRP and Sed rate  Testing/Procedures: Your physician has requested that you have an echocardiogram. Echocardiography is a painless test that uses sound waves to create images of your heart. It provides your doctor with information about the size and shape of your heart and how well your heart's chambers and valves are working.   You may receive an ultrasound enhancing agent through an IV if needed to better visualize your heart during the echo. This procedure takes approximately one hour.  There are no restrictions for this procedure.  This will take place at [address] [facility] [address]) [address], [place] [address]  Please note: We ask at that you not bring children with you during ultrasound (echo/ vascular) testing. Due to room size and safety concerns, children are not allowed in the ultrasound rooms during exams. Our front office staff cannot provide observation of children in our lobby area while testing is being conducted. An adult accompanying a patient to their appointment will only be allowed in the ultrasound room at the discretion of the ultrasound technician under special circumstances. We apologize for any inconvenience.   Follow-Up: At [facility], you and your health needs are our priority.  As part of our continuing mission to provide you with exceptional heart care, we have created designated Provider Care Teams.  These Care Teams include your primary Cardiologist (physician) and Advanced Practice Providers (APPs -  Physician Assistants and Nurse Practitioners) who all work  together to provide you with the care you need, when you need it.  You will need a follow up appointment as needed  Providers on your designated Care Team:   Laneta Pintos, NP Varney Gentleman, PA-C Cadence Gennaro Khat, [place]  COVID-19 Vaccine Information can be found at: [email or web address] For questions related to vaccine distribution or appointments, please email vaccine@ [email or web address] or call [phone].

## 2024-04-21 ENCOUNTER — Ambulatory Visit: Payer: Self-pay | Admitting: Emergency Medicine

## 2024-05-06 ENCOUNTER — Ambulatory Visit: Attending: Cardiovascular Disease

## 2024-05-06 DIAGNOSIS — R079 Chest pain, unspecified: Secondary | ICD-10-CM

## 2024-05-10 LAB — ECHOCARDIOGRAM COMPLETE
AR max vel: 2.79 cm2
AV Area VTI: 2.53 cm2
AV Area mean vel: 2.58 cm2
AV Mean grad: 5 mmHg
AV Peak grad: 8.4 mmHg
Ao pk vel: 1.45 m/s
Area-P 1/2: 3.99 cm2
S' Lateral: 3.44 cm

## 2024-05-18 ENCOUNTER — Ambulatory Visit: Admitting: Urology

## 2024-05-18 VITALS — BP 121/73 | HR 56 | Ht 71.0 in | Wt 188.1 lb

## 2024-05-18 DIAGNOSIS — Z3009 Encounter for other general counseling and advice on contraception: Secondary | ICD-10-CM

## 2024-05-18 NOTE — Progress Notes (Signed)
 Elfrieda Grise Plume,acting as a scribe for Paul Gimenez, MD.,have documented all relevant documentation on the behalf of Paul Gimenez, MD,as directed by  Paul Gimenez, MD while in the presence of Paul Gimenez, MD.  05/18/24 12:44 PM   Cathi Cluster 1995-12-10 [id number]   Chief Complaint  Patient presents with   VAS Consult    HPI: 28 y.o. year old male referred for further evaluation of possible vasectomy.  He denies a history of testicular trauma or pain.  No urinary issues.  No previous scrotal surgeries.  He and his wife have 2 children and desire no further pregnancies.  He is currently employed as a Market researcher.    PMH: Past Medical History:  Diagnosis Date   Migraines     Surgical History: Past Surgical History:  Procedure Laterality Date   NO PAST SURGERIES      Home Medications:  Allergies as of 05/18/2024   No Known Allergies      Medication List        Accurate as of May 18, 2024 12:44 PM. If you have any questions, ask your nurse or doctor.          STOP taking these medications    acetaminophen 500 MG tablet Commonly known as: TYLENOL   albuterol  108 (90 Base) MCG/ACT inhaler Commonly known as: VENTOLIN  HFA   colchicine  0.6 MG tablet   pantoprazole  40 MG tablet Commonly known as: Protonix         Family History: Family History  Problem Relation Age of Onset   Heart attack Paternal Grandfather    Heart murmur Paternal Grandfather     Social History:  reports that he has never smoked. He has never used smokeless tobacco. He reports current alcohol use. He reports that he does not use drugs.   Physical Exam: BP 121/73   Pulse (!) 56   Ht 5' 11 (1.803 m)   Wt 188 lb 2 oz (85.3 kg)   BMI 26.24 kg/m   Constitutional:  Alert and oriented, No acute distress. HEENT: [place] AT, moist mucus membranes.  Trachea midline, no masses. Cardiovascular: No clubbing, cyanosis, or edema. Respiratory: Normal respiratory effort, no  increased work of breathing. GI: Abdomen is soft, nontender, nondistended, no abdominal masses GU: Normal phallus.  Bilateral descended testicles without masses.  Vasa easily palpable bilaterally. Skin: No rashes, bruises or suspicious lesions. Neurologic: Grossly intact, no focal deficits, moving all 4 extremities. Psychiatric: Normal mood and affect.   Assessment & Plan:    1. Vasectomy evaluation Today, we discussed what the vas deferens is, where it is located, and its function. We reviewed the procedure for vasectomy, it's risks, benefits, alternatives, and likelihood of achieving his goals. We discussed in detail the procedure, complications, and recovery as well as the need for clearance prior to unprotected intercourse. We discussed that vasectomy does not protect against sexually transmitted diseases. We discussed that this procedure does not result in immediate sterility and that they would need to use other forms of birth control until he has been cleared with negative postvasectomy semen analyses. I explained that the procedure is considered to be permanent and that attempts at reversal have varying degrees of success. These options include vasectomy reversal, sperm retrieval, and in vitro fertilization; these can be very expensive. We discussed the chance of postvasectomy pain syndrome which occurs in less than 5% of patients. I explained to the patient that there is no treatment to resolve this chronic pain, and that if  it developed I would not be able to help resolve the issue, but that surgery is generally not needed for correction. I explained there have even been reports of systemic like illness associated with this chronic pain, and that there was no good cure. I explained that vasectomy it is not a 100% reliable form of birth control, and the risk of pregnancy after vasectomy is approximately 1 in 2000 men who had a negative postvasectomy semen analysis or rare non-motile sperm. I  explained that repeat vasectomy was necessary in less than 1% of vasectomy procedures when employing the type of technique that I use. I explained that he should refrain from ejaculation for approximately one week following vasectomy. I explained that there are other options for birth control which are permanent and non-permanent; we discussed these. I explained the rates of surgical complications, such as symptomatic hematoma or infection, are low (1-2%) and vary with the surgeon's experience and criteria used to diagnose the complication.   The patient had the opportunity to ask questions to his stated satisfaction. He voiced understanding of the above factors and stated that he has read all the information provided to him and the packets and informed consent.  He is interested in receiving of Valium 10 mg prior to the procedure for the purpose of anxiolysis.  A prescription was given today.  He will have a driver on the day of the procedure.   Return for vasectomy.   [facility] [address], [address] [place], [place] [address] [phone]

## 2024-05-18 NOTE — Patient Instructions (Signed)
 Pre-Vasectomy Instructions ? ?STOP all aspirin or blood thinners (Aspirin, Plavix, Coumadin, Warfarin, Motrin, Ibuprofen, Advil, Aleve, Naproxen, Naprosyn) for 7 days prior to the procedure.  If you have any questions about stopping these medications please contact your primary care physician or cardiologist. ? ?Shave all hair from the upper scrotum on the day of the procedure.  This means just under the penis onto the scrotal sac.  The area shaved should measure about 2-3 inches around.  You may lather the scrotum with soap and water, and shave with a safety razor. ? ?After shaving the area, thoroughly wash the penis and the scrotum, then shower or bathe to remove all the loose hairs.  If needed, wash the area again just before coming in for your Vasectomy. ? ?It is recommended to have a light meal an hour or so prior to the procedure. ? ?Bring a scrotal support (jock strap or suspensory, or tight jockey shorts or underwear).  Wear comfortable pants or shorts. ? ?While the actual procedure usually takes about 45 minutes, you should be prepared to stay in the office for approximately one hour.  Bring someone with you to drive you home. ? ?If you have any questions or concerns, please feel free to call the office at [phone]. ?  ?

## 2024-05-19 MED ORDER — DIAZEPAM 10 MG PO TABS: ORAL_TABLET | ORAL | 0 refills | Status: DC

## 2024-06-22 ENCOUNTER — Ambulatory Visit (INDEPENDENT_AMBULATORY_CARE_PROVIDER_SITE_OTHER): Admitting: Urology

## 2024-06-22 VITALS — BP 134/70 | HR 66 | Ht 71.0 in | Wt 181.0 lb

## 2024-06-22 DIAGNOSIS — Z302 Encounter for sterilization: Secondary | ICD-10-CM | POA: Diagnosis not present

## 2024-06-22 NOTE — Patient Instructions (Signed)

## 2024-06-22 NOTE — Progress Notes (Signed)
 VASECTOMY PROCEDURE NOTE:  The patient was taken to the minor procedure room and placed in the supine position. His genitals were prepped and draped in the usual sterile fashion. The right vas deferens was brought up to the skin of the right upper scrotum. The skin overlying it was anesthetized with 1% lidocaine  without epinephrine, anesthetic was also injected alongside the vas deferens in the direction of the inguinal canal. The no scalpel vasectomy instrument was used to make a small perforation in the scrotal skin. The vasectomy clamp was used to grasp the vas deferens. It was carefully dissected free from surrounding structures. A 1cm segment of the vas was removed, and the cut ends of the mucosa were cauterized. No significant bleeding was noted. The vas deferens was returned to the scrotum. The skin incision was closed with a simple interrupted stitch of 4-0 chromic.  Attention was then turned to the left side. The left vasectomy was performed in the same exact fashion. Sterile dressings were placed over each incision. The patient tolerated the procedure well.  IMPRESSION/DIAGNOSIS: The patient is a 28 year old gentleman who underwent a vasectomy today. Post-procedure instructions were reviewed. I stressed the importance of continuing to use birth control until he provides a semen specimen more than 2 months from now that demonstrates azoospermia.  We discussed return precautions including fever over 101, significant bleeding or hematoma, or uncontrolled pain. I also stressed the importance of avoiding strenuous activity for one week, no sexual activity or ejaculations for 5 days, intermittent icing over the next 48 hours, and scrotal support.   PLAN: The patient will be advised of his semen analysis results when available.  Redell Burnet, MD 06/22/2024

## 2024-10-27 ENCOUNTER — Other Ambulatory Visit

## 2024-10-27 DIAGNOSIS — Z302 Encounter for sterilization: Secondary | ICD-10-CM

## 2024-10-28 LAB — POST-VAS SPERM EVALUATION,QUAL: Volume: 4.4 mL

## 2024-10-31 ENCOUNTER — Ambulatory Visit: Payer: Self-pay | Admitting: Urology
